# Patient Record
Sex: Female | Born: 1990 | Race: Black or African American | Hispanic: No | Marital: Single | Smoking: Never smoker
Health system: Southern US, Community
[De-identification: ages and names within clinical notes are randomized; demographics above are authoritative.]

## PROBLEM LIST (undated history)

## (undated) DIAGNOSIS — Z789 Other specified health status: Secondary | ICD-10-CM

## (undated) HISTORY — PX: WISDOM TOOTH EXTRACTION: SHX21

---

## 2014-03-10 ENCOUNTER — Encounter (HOSPITAL_COMMUNITY): Payer: Self-pay | Admitting: Emergency Medicine

## 2014-03-10 ENCOUNTER — Emergency Department (HOSPITAL_COMMUNITY): Payer: Managed Care, Other (non HMO)

## 2014-03-10 ENCOUNTER — Emergency Department (HOSPITAL_COMMUNITY)
Admission: EM | Admit: 2014-03-10 | Discharge: 2014-03-10 | Disposition: A | Discharge status: Home / Self Care | Payer: Managed Care, Other (non HMO) | Attending: Emergency Medicine | Admitting: Emergency Medicine

## 2014-03-10 DIAGNOSIS — R059 Cough, unspecified: Secondary | ICD-10-CM

## 2014-03-10 DIAGNOSIS — R69 Illness, unspecified: Secondary | ICD-10-CM

## 2014-03-10 DIAGNOSIS — J111 Influenza due to unidentified influenza virus with other respiratory manifestations: Secondary | ICD-10-CM

## 2014-03-10 DIAGNOSIS — R05 Cough: Secondary | ICD-10-CM | POA: Insufficient documentation

## 2014-03-10 DIAGNOSIS — R5383 Other fatigue: Secondary | ICD-10-CM | POA: Diagnosis not present

## 2014-03-10 MED ORDER — HYDROCODONE-ACETAMINOPHEN 5-325 MG PO TABS: ORAL_TABLET | ORAL | Status: DC

## 2014-03-10 MED ORDER — HYDROCODONE-ACETAMINOPHEN 5-325 MG PO TABS
1.0000 | ORAL_TABLET | Freq: Once | ORAL | Status: AC
  Administered 2014-03-10: 1 via ORAL
  Filled 2014-03-10: qty 1

## 2014-03-10 NOTE — Discharge Instructions (Signed)
Return to the emergency room for any worsening or concerning symptoms including fast breathing, heart racing, confusion, vomiting.  Rest, cover your mouth when you cough and wash your hands frequently.   Push fluids: water or Gatorade, do not drink any soda, juice or caffeinated beverages.  For fever and pain control you can take Motrin (ibuprofen) as follows: 400 mg (this is normally 2 over the counter pills) every 4 hours with food.  Do not return to work until a day after your fever breaks.   Take Vicodin for cough and pain control, do not drink alcohol, drive, care for children or do other critical tasks while taking Vicodin.    Emergency Department Resource Guide 1) Find a Doctor and Pay Out of Pocket Although you won't have to find out who is covered by your insurance plan, it is a good idea to ask around and get recommendations. You will then need to call the office and see if the doctor you have chosen will accept you as a new patient and what types of options they offer for patients who are self-pay. Some doctors offer discounts or will set up payment plans for their patients who do not have insurance, but you will need to ask so you aren't surprised when you get to your appointment.  2) Contact Your Local Health Department Not all health departments have doctors that can see patients for sick visits, but many do, so it is worth a call to see if yours does. If you don't know where your local health department is, you can check in your phone book. The CDC also has a tool to help you locate your state's health department, and many state websites also have listings of all of their local health departments.  3) Find a Walk-in Clinic If your illness is not likely to be very severe or complicated, you may want to try a walk in clinic. These are popping up all over the country in pharmacies, drugstores, and shopping centers. They're usually staffed by nurse practitioners or physician  assistants that have been trained to treat common illnesses and complaints. They're usually fairly quick and inexpensive. However, if you have serious medical issues or chronic medical problems, these are probably not your best option.  No Primary Care Doctor: - Call Health Connect at  [phone] - they can help you locate a primary care doctor that  accepts your insurance, provides certain services, etc. - Physician Referral Service- [phone]  Chronic Pain Problems: Organization         Address  Phone   Notes  [facility]  [phone]) [phone] Patients need to be referred by their primary care doctor.   Medication Assistance: Organization         Address  Phone   Notes  [facility] Medication [facility] [address] [place], [place] [address] [phone] --Must be a resident of [place] -- Must have NO insurance coverage whatsoever (no Medicaid/ Medicare, etc.) -- The pt. MUST have a primary care doctor that directs their care regularly and follows them in the community   MedAssist  [phone]   [employer]  [phone]    Agencies that provide inexpensive medical care: Organization         Address  Phone   Notes  [facility]  [phone]) [phone]   [facility]    [phone]) [phone]   [facility] [address]  [place], [place] [address] [phone]   [place] [address], [place] [phone]   Planned Parenthood    [phone]   [facility]    [phone]   [employer] and [place] [address], [place] Phone:  [phone], Fax:  [phone] Hours of Operation:  9 am - 6 pm, M-F.  Also accepts Medicaid/Medicare and self-pay.  [facility] for [address], [address], [place] Phone: [phone], Fax: [phone]. Hours of Operation:  8:30 am - 5:30 pm, M-F.  Also accepts  Medicaid and self-pay.  [facility] [place] [address], [place] Phone: [phone]   [place], [place], [place] [phone], Ext. [phone] Mondays & Thursdays: 7-9 AM.  First 15 patients are seen on a first come, first serve basis.    [place] Providers:  Organization         Address  Phone   Notes  [facility] [address], [place] [phone] Also accepts self-pay patients.  [facility] [address], [place]  [phone]   [place], [address], [place] [phone]   [facility] [address], [place] [phone]   Lucianne Lei [address], [place]   [phone] Only accepts [place] Access [place] patients after they have their name applied to their card.   Self-Pay (no insurance) in [facility]:  Organization         Address  Phone   Notes  Sickle Cell Patients, [facility] [place] [phone]   [facility] [place] [phone]   [facility] [place]  [place], [place], [place] [phone]   [facility]/Dr. Osei-Bonsu  [address], [place] or [address], [place] [phone] Phone number for both [place] and [place] locations is the same.  [facility] [address] [place] [phone]   [facility] [address] [place] or [address] [phone]   [facility] [address] [place] [phone], phone; [phone], fax Sees patients 1st and 3rd Saturday of every month.  Must not qualify for public or private insurance (i.e. Medicaid, Medicare, [place] Health Choice, Veterans' Benefits)  Household  income should be no more than 200% of the poverty level The clinic cannot treat you if you are pregnant or think you are pregnant  Sexually transmitted diseases are not treated at the clinic.    Dental Care: Organization         Address  Phone  Notes  [facility] [phone] Accepts children up to age 33 who are enrolled in [place] or [place]; pregnant women with a Medicaid card; and children who have applied for Medicaid or [place] Health Choice, but were declined, whose parents can pay a reduced fee at time of service.  [facility] of [facility]  [address], [place] [phone] Accepts children up to age 59 who are enrolled in [place] or [place]  Choice; pregnant women with a Medicaid card; and children who have applied for Medicaid or [place] Health Choice, but were declined, whose parents can pay a reduced fee at time of service.  [place] [facility] Adult Dental Access PROGRAM  [place] [phone] Patients are seen by appointment only. Walk-ins are not accepted. [place] will see patients 10 years of age and older. Monday - Tuesday (8am-5pm) Most Wednesdays (8:30-5pm) $30 per visit, cash only  [facility] Adult Dental Access PROGRAM  [address], [facility] [phone] Patients are seen by appointment only. Walk-ins are not accepted. [place] will see patients 67 years of age and older. One Wednesday Evening (Monthly: Volunteer Based).  $30 per visit, cash only  [place]  [phone] for adults; Children under age 37, call Graduate Pediatric Dentistry at [phone]. Children aged 26-14, please call [phone] to request a pediatric application.  Dental services are provided in all areas of dental care including fillings, crowns and bridges, complete and partial dentures, implants, gum  treatment, root canals, and extractions. Preventive care is also provided. Treatment is provided to both adults and children. Patients are selected via a lottery and there is often a waiting list.   [facility] [address] [place]  [phone] [email or web address]   [facility] [address] [place], [place] [phone], Ext. [phone] Second and Fourth Thursday of each month, opens at 6:30 AM; Clinic ends at 9 AM.  Patients are seen on a first-come first-served basis, and a limited number are seen during each clinic.   [facility]  [address] Hillard Dank[place], [place] [phone]   Eligibility Requirements You must have lived in [place], [place], or [place] counties for at least the last three months.   You cannot be eligible for state or federal sponsored [employer], including [employer], [place], or [employer].   You generally cannot be eligible for healthcare insurance through your employer.    How to apply: Eligibility screenings are held every Tuesday and Wednesday afternoon from 1:00 pm until 4:00 pm. You do not need an appointment for the interview!  [facility] [address], [place], [place]   [place]  [place] Department  [place]  [phone]    Behavioral Health Resources in the Community: Intensive Outpatient Programs Organization         Address  Phone  Notes  [place]. [address] [place], [place] [phone]   [facility] [address], [place], [place]   ADS: Alcohol & Drug Svcs [address] [place], [place]   [place] [address],  [place], [place] or [phone]   Substance Abuse Resources Organization         Address  Phone  Notes  Alcohol and  Drug Services  [phone]   [place]  [phone]   The [place]   Chinita Pester  [phone]   Residential & Outpatient Substance Abuse Program  [phone]   Psychological Services Organization         Address  Phone  Notes  [facility]  [place]  [phone]   [place] [address], [place] or [phone]  Mobile Crisis Teams Organization         Address  Phone  Notes  [facility], Mobile Crisis Care Unit  [phone]   [facility]  [address] [place], [place] [phone]   Doristine LocksSharon DeEsch [address] [place] [phone]    Self-Help/Support Groups Organization         Address  Phone             Notes  Mental Health Assoc. of [place] - variety of support groups  336- [phone] Call for more information  Narcotics Anonymous (NA), [facility]aring [facility] [address], [place]  2 meetings at this location   Statisticianesidential Treatment Programs Organization         Address  Phone  Notes  [facility] [address],    [place]  [phone]   [facility]  [address], [place] [phone], [place], [place] [phone]   [facility] [address], [place] [phone] Admissions: 8am-3pm M-F  [facility] [address]    [place], [place] [phone]   [facility] [address], [place], [place] [phone]   [facility] [address]  [place], [place] [phone]   [facility] - Intensive Outpatient [address], [place], [place] [phone]   [facility] (Addiction Recovery Care [facility] [address]  [place], [place] [phone] or [phone]   [facility] (RTS) [address] [place], [place] [phone] Accepts Medicaid  [place]  [place] [address]  [place] [phone] Substance Abuse/Addiction Treatment   [facility] Behavioral Health Resources Organization         Address  Phone  Notes  [facility]  [phone]) [phone]   Angie FavaJulie Brannon, PhD [address], [address] [place], [place]   [phone](336) [phone] or [phone]   [facility]   [address] [place], [place] [phone](336) [phone]   [facility] [address], [place], [place] [phone](336) [phone] Insurance/Medicaid/sponsorship through [facility]  Faith and Families [address]                                    [place], [place] [phone]) [phone] Therapy/tele-psych/case  [facility] [address].   [place], [place] [phone](336) [phone]    Dr. Lolly MustacheArfeen  [phone]) [phone]   [facility] [place]  [employer] [facility] 1) [address], [place] 2) [address], [place] 3)  [address], [place] [phone](336) [phone]) [phone]  [phone](336) [phone]   [facility] Child Abuse Hotline [phone](336) [phone] or [phone](336) [phone] (After Hours)

## 2014-03-10 NOTE — ED Notes (Signed)
Flu like symptoms starting a couple days ago. SOB, fatigue, headache, chills, sinus congestion.

## 2014-03-10 NOTE — ED Provider Notes (Signed)
CSN: [id number]     Arrival date & time 03/10/14  1234 History  This chart was scribed for Wynetta EmeryNicole Kimm Ungaro, PA-C, working with Arby BarretteMarcy Pfeiffer, MD found by Elon SpannerGarrett Cook, ED Scribe. This patient was seen in room WTR8/WTR8 and the patient's care was started at 1:19 PM.   Chief Complaint  Patient presents with  . Cough  . Fatigue   The history is provided by the patient.   HPI Comments: Martha Fox is a 23 y.o. female with no significant medical history who presents to the Emergency Department complaining of nasal congestion with associated dry cough, subjective fever, generalized weakness and mild SOB upon exertion.  Patient reports she took a medication last night for her symptoms but does not recall the name.  Patient denies recent sick contacts.  Patient denies receiving a flu shot.  Patient denies chest pain, abdominal pain, nausea, vomiting, ear pain, changes in frequency, changes in bowel movement patterns.      History reviewed. No pertinent past medical history. History reviewed. No pertinent past surgical history. History reviewed. No pertinent family history. History  Substance Use Topics  . Smoking status: Never Smoker   . Smokeless tobacco: Not on file  . Alcohol Use: No   OB History    No data available     Review of Systems A complete 10 system review of systems was obtained and all systems are negative except as noted in the HPI and PMH.    Allergies  Review of patient's allergies indicates no known allergies.  Home Medications   Prior to Admission medications   Not on File   BP 129/75 mmHg  Pulse 104  Temp(Src) 98.2 F (36.8 C) (Oral)  Resp 18  SpO2 100%  LMP 02/25/2014 Physical Exam  Constitutional: She is oriented to person, place, and time. She appears well-developed and well-nourished. No distress.  Nontoxic appearing  HENT:  Head: Normocephalic and atraumatic.  Mouth/Throat: Oropharynx is clear and moist.  Eyes: Conjunctivae and EOM are  normal. Pupils are equal, round, and reactive to light.  Neck: Normal range of motion. Neck supple.  FROM to C-spine. Pt can touch chin to chest without discomfort. No TTP of midline cervical spine.   Cardiovascular: Normal rate, regular rhythm and intact distal pulses.   Pulmonary/Chest: Effort normal and breath sounds normal. No stridor. No respiratory distress. She has no wheezes. She has no rales. She exhibits no tenderness.  Abdominal: Soft. Bowel sounds are normal. She exhibits no distension and no mass. There is no tenderness. There is no rebound and no guarding.  Musculoskeletal: Normal range of motion. She exhibits no edema or tenderness.  Neurological: She is alert and oriented to person, place, and time.  Psychiatric: She has a normal mood and affect.  Nursing note and vitals reviewed.   ED Course  Procedures (including critical care time)  DIAGNOSTIC STUDIES: Oxygen Saturation is 100% on RA, normal by my interpretation.    COORDINATION OF CARE:  1:27 PM Will order imaging to r/o pneumonia and prescribe pain medication.  Advised patient to use Motrin as needed and increase fluid intake.  Patient was offered but declined Tamiflu.  Patient advised of return precuations.  Patient acknowledges and agrees with plan.    Labs Review Labs Reviewed - No data to display  Imaging Review No results found.   EKG Interpretation None      MDM   Final diagnoses:  Cough    Filed Vitals:   03/10/14 1255 03/10/14 1412  BP: 129/75 109/54  Pulse: 104 99  Temp: 98.2 F (36.8 C)   TempSrc: Oral   Resp: 18 16  SpO2: 100% 99%    Medications  HYDROcodone-acetaminophen (NORCO/VICODIN) 5-325 MG per tablet 1 tablet (1 tablet Oral Given 03/10/14 1341)    Martha Fox is a 23 y.o. female presenting with dry cough, headache, myalgia, pharyngitis, rhinorrhea shortness of breath and fatigue. Symptoms consistent with influenza. Patient is otherwise healthy. Offered her Tamiflu  issues within the 48 hour window. Shared decision-making capacity patient declines Tamiflu, will treat symptomatically, advised patient to aggressively hydrate, control fever and extensive discussion of return precautions.  Evaluation does not show pathology that would require ongoing emergent intervention or inpatient treatment. Pt is hemodynamically stable and mentating appropriately. Discussed findings and plan with patient/guardian, who agrees with care plan. All questions answered. Return precautions discussed and outpatient follow up given.   Discharge Medication List as of 03/10/2014  2:06 PM    START taking these medications   Details  HYDROcodone-acetaminophen (NORCO/VICODIN) 5-325 MG per tablet Take 1-2 tablets by mouth every 6 hours as needed for pain., Print         I personally performed the services described in this documentation, which was scribed in my presence. The recorded information has been reviewed and is accurate.    Wynetta Emeryicole Denzell Colasanti, PA-C 03/10/14 2015  Arby BarretteMarcy Pfeiffer, MD 03/11/14 [phone]

## 2015-04-10 IMAGING — CR DG CHEST 2V
2 series · 2 of 2 positions shown · non-contrast
Comparison: None.

CLINICAL DATA: Flu-like symptoms for a couple of days. Fatigue with
shortness of breath and headache.

EXAM:
CHEST  2 VIEW

[w chest pa]
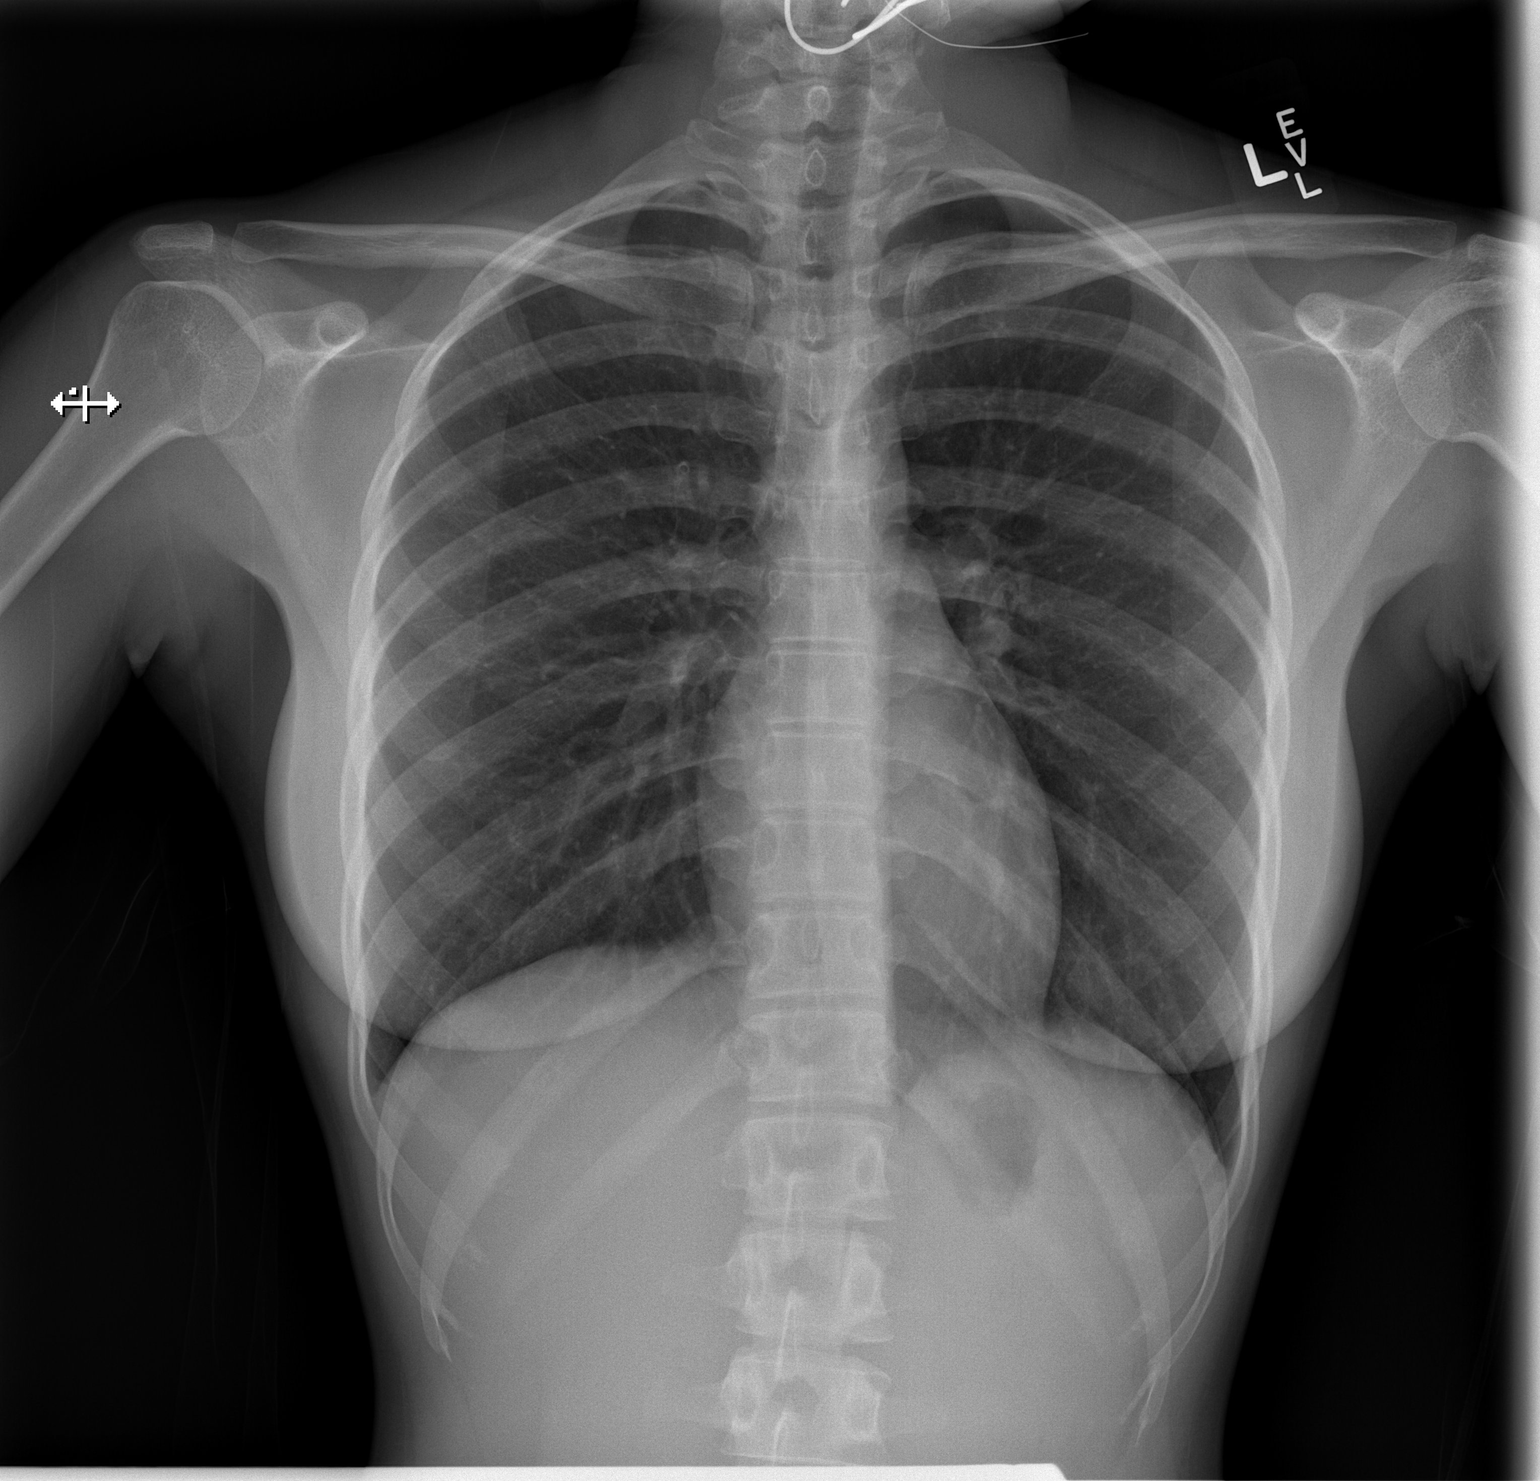

[w chest lat]
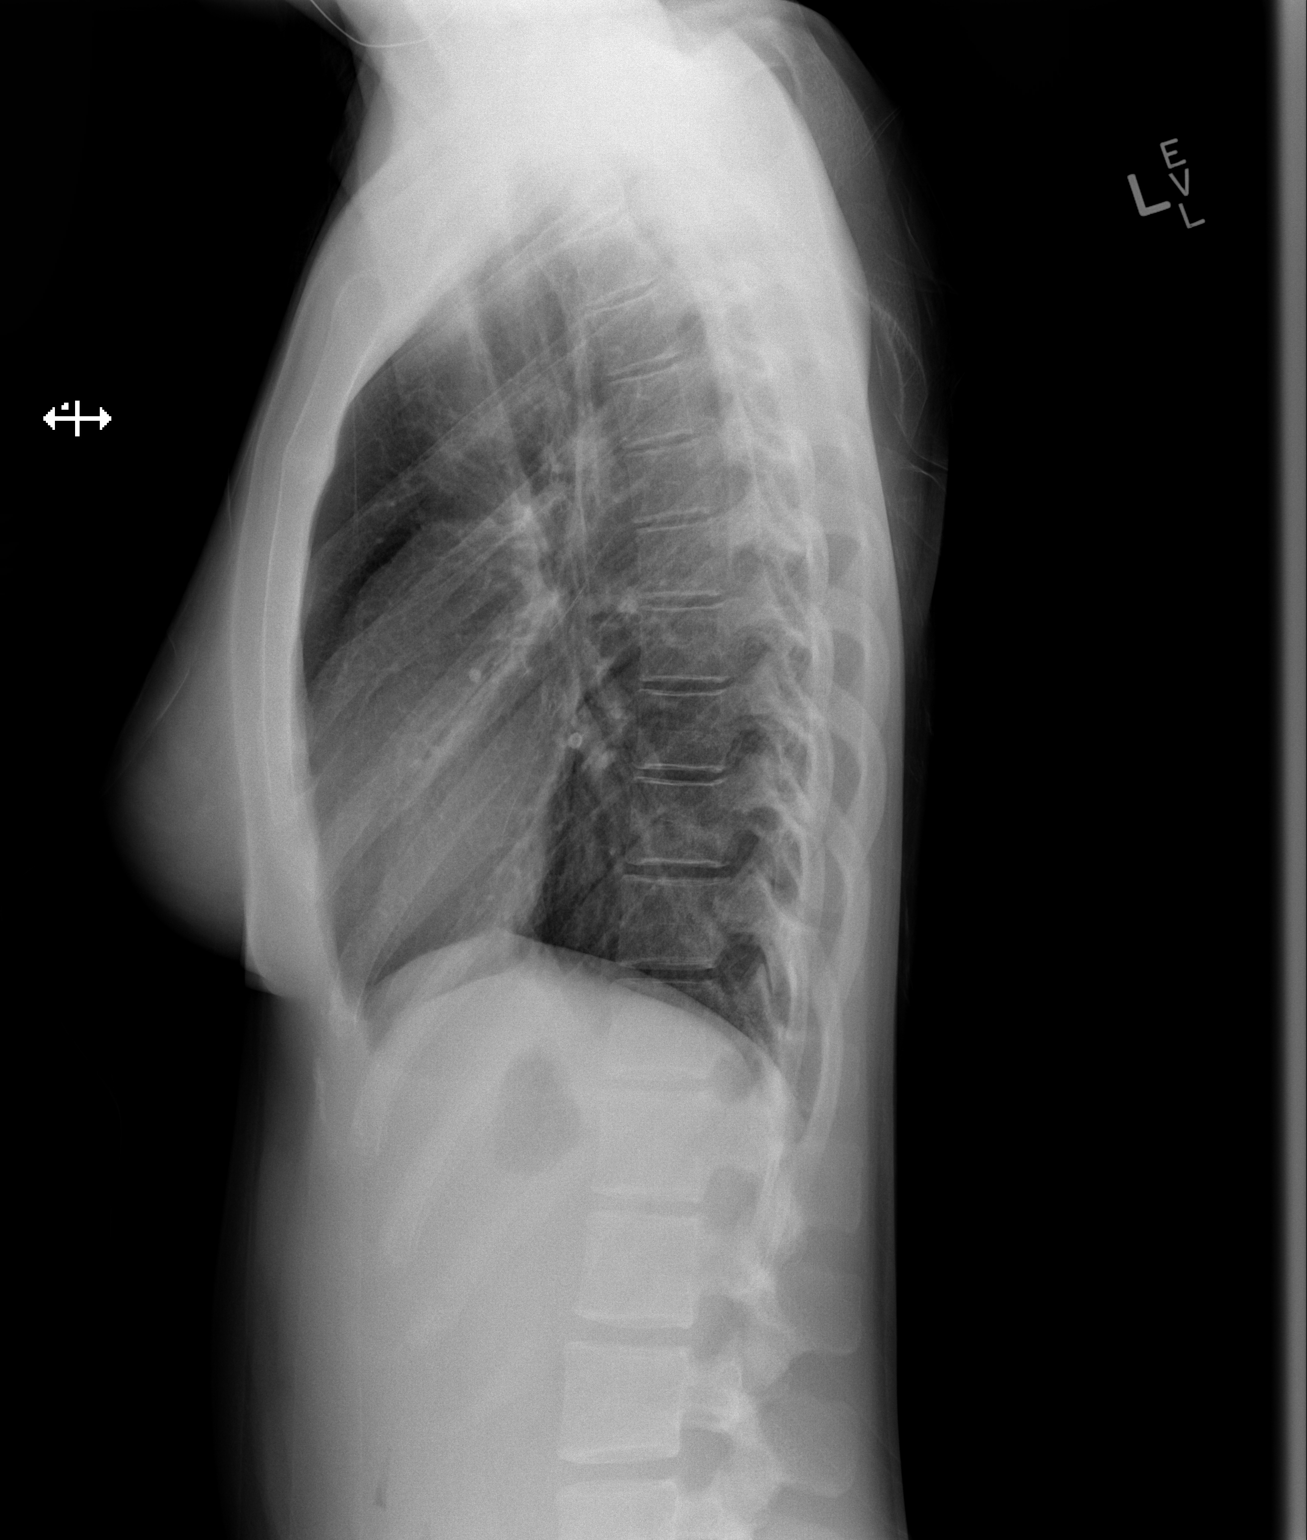

[2 of 2 positions shown; findings below may reference images not displayed]

FINDINGS: Lung volumes are within normal limits. Heart and mediastinum are
within normal limits. Trachea is midline. Lungs are clear. No acute
bone abnormality.
IMPRESSION: Normal chest examination.

## 2016-03-03 LAB — OB RESULTS CONSOLE ABO/RH: RH Type: POSITIVE

## 2016-03-03 LAB — OB RESULTS CONSOLE RPR: RPR: NONREACTIVE

## 2016-03-03 LAB — OB RESULTS CONSOLE ANTIBODY SCREEN: Antibody Screen: NEGATIVE

## 2016-03-03 LAB — OB RESULTS CONSOLE GC/CHLAMYDIA
CHLAMYDIA, DNA PROBE: NEGATIVE
Gonorrhea: NEGATIVE

## 2016-03-03 LAB — OB RESULTS CONSOLE RUBELLA ANTIBODY, IGM: Rubella: IMMUNE

## 2016-03-03 LAB — OB RESULTS CONSOLE HEPATITIS B SURFACE ANTIGEN: HEP B S AG: NEGATIVE

## 2016-03-03 LAB — OB RESULTS CONSOLE HIV ANTIBODY (ROUTINE TESTING): HIV: NONREACTIVE

## 2016-09-25 LAB — OB RESULTS CONSOLE GBS: GBS: NEGATIVE

## 2016-10-08 ENCOUNTER — Other Ambulatory Visit: Payer: Self-pay | Admitting: Obstetrics and Gynecology

## 2016-10-09 ENCOUNTER — Encounter (HOSPITAL_COMMUNITY): Payer: Self-pay | Admitting: *Deleted

## 2016-10-14 NOTE — Patient Instructions (Signed)
20 Amirra Ballin  10/14/2016   Your procedure is scheduled on:  10/17/2016  Enter through the Main Entrance [facility] at 1015 AM.  Pick up the phone at the desk and dial [phone].   Call this number if you have problems the morning of surgery: [phone]   Remember:   Do not eat food:After Midnight.  Do not drink clear liquids: After Midnight.  Take these medicines the morning of surgery with A SIP OF WATER: none   Do not wear jewelry, make-up or nail polish.  Do not wear lotions, powders, or perfumes. Do not wear deodorant.  Do not shave 48 hours prior to surgery.  Do not bring valuables to the hospital.  [facility] is not   responsible for any belongings or valuables brought to the hospital.  Contacts, dentures or bridgework may not be worn into surgery.  Leave suitcase in the car. After surgery it may be brought to your room.  For patients admitted to the hospital, checkout time is 11:00 AM the day of              discharge.   Patients discharged the day of surgery will not be allowed to drive             home.  Name and phone number of your driver: na  Special Instructions:   N/A   Please read over the following fact sheets that you were given:   Surgical Site Infection Prevention

## 2016-10-15 MED ORDER — GLYCOPYRROLATE 0.2 MG/ML IJ SOLN
INTRAMUSCULAR | Status: AC
  Filled 2016-10-15: qty 1

## 2016-10-15 MED ORDER — EPHEDRINE SULFATE 50 MG/ML IJ SOLN
INTRAMUSCULAR | Status: AC
  Filled 2016-10-15: qty 1

## 2016-10-15 MED ORDER — LIDOCAINE HCL 1 % IJ SOLN
INTRAMUSCULAR | Status: AC
  Filled 2016-10-15: qty 20

## 2016-10-15 MED ORDER — SCOPOLAMINE 1 MG/3DAYS TD PT72
MEDICATED_PATCH | TRANSDERMAL | Status: AC
  Filled 2016-10-15: qty 1

## 2016-10-15 MED ORDER — PHENYLEPHRINE 40 MCG/ML (10ML) SYRINGE FOR IV PUSH (FOR BLOOD PRESSURE SUPPORT)
PREFILLED_SYRINGE | INTRAVENOUS | Status: AC
  Filled 2016-10-15: qty 20

## 2016-10-16 ENCOUNTER — Encounter (HOSPITAL_COMMUNITY): Payer: Self-pay

## 2016-10-16 ENCOUNTER — Encounter (HOSPITAL_COMMUNITY)
Admission: RE | Admit: 2016-10-16 | Discharge: 2016-10-16 | Disposition: A | Discharge status: Home / Self Care | Payer: Medicaid Other | Source: Ambulatory Visit | Attending: Obstetrics and Gynecology | Admitting: Obstetrics and Gynecology

## 2016-10-16 HISTORY — DX: Other specified health status: Z78.9

## 2016-10-16 LAB — CBC
HCT: 42.6 % (ref 36.0–46.0)
HEMOGLOBIN: 14.6 g/dL (ref 12.0–15.0)
MCH: 30.1 pg (ref 26.0–34.0)
MCHC: 34.3 g/dL (ref 30.0–36.0)
MCV: 87.8 fL (ref 78.0–100.0)
Platelets: 174 10*3/uL (ref 150–400)
RBC: 4.85 MIL/uL (ref 3.87–5.11)
RDW: 15.4 % (ref 11.5–15.5)
WBC: 9.7 10*3/uL (ref 4.0–10.5)

## 2016-10-16 LAB — ABO/RH: ABO/RH(D): O POS

## 2016-10-16 LAB — TYPE AND SCREEN
ABO/RH(D): O POS
Antibody Screen: NEGATIVE

## 2016-10-16 NOTE — Pre-Procedure Instructions (Signed)
PAT completed by Royal Hawthornaney Damarkus Balis RNC on 10/16/2016:

## 2016-10-17 ENCOUNTER — Inpatient Hospital Stay (HOSPITAL_COMMUNITY)
Admission: RE | Admit: 2016-10-17 | Discharge: 2016-10-20 | DRG: 765 | Disposition: A | Discharge status: Home / Self Care | Payer: Medicaid Other | Source: Ambulatory Visit | Attending: Obstetrics and Gynecology | Admitting: Obstetrics and Gynecology

## 2016-10-17 ENCOUNTER — Encounter (HOSPITAL_COMMUNITY): Payer: Self-pay | Admitting: Anesthesiology

## 2016-10-17 ENCOUNTER — Encounter (HOSPITAL_COMMUNITY)
Admission: RE | Disposition: A | Discharge status: Home / Self Care | Payer: Self-pay | Source: Ambulatory Visit | Attending: Obstetrics and Gynecology

## 2016-10-17 ENCOUNTER — Inpatient Hospital Stay (HOSPITAL_COMMUNITY): Payer: Medicaid Other | Admitting: Anesthesiology

## 2016-10-17 DIAGNOSIS — O30043 Twin pregnancy, dichorionic/diamniotic, third trimester: Secondary | ICD-10-CM | POA: Diagnosis present

## 2016-10-17 DIAGNOSIS — Z3A38 38 weeks gestation of pregnancy: Secondary | ICD-10-CM

## 2016-10-17 DIAGNOSIS — O322XX2 Maternal care for transverse and oblique lie, fetus 2: Principal | ICD-10-CM | POA: Diagnosis present

## 2016-10-17 LAB — RPR: RPR Ser Ql: NONREACTIVE

## 2016-10-17 SURGERY — Surgical Case
Anesthesia: Spinal | Site: Abdomen | Wound class: Clean Contaminated

## 2016-10-17 MED ORDER — SIMETHICONE 80 MG PO CHEW
80.0000 mg | CHEWABLE_TABLET | ORAL | Status: DC
  Administered 2016-10-18 – 2016-10-20 (×3): 80 mg via ORAL
  Filled 2016-10-17 (×3): qty 1

## 2016-10-17 MED ORDER — PRENATAL MULTIVITAMIN CH
1.0000 | ORAL_TABLET | Freq: Every day | ORAL | Status: DC
  Administered 2016-10-18 – 2016-10-20 (×3): 1 via ORAL
  Filled 2016-10-17 (×3): qty 1

## 2016-10-17 MED ORDER — OXYTOCIN 10 UNIT/ML IJ SOLN
INTRAMUSCULAR | Status: AC
  Filled 2016-10-17: qty 4

## 2016-10-17 MED ORDER — MEPERIDINE HCL 25 MG/ML IJ SOLN
INTRAMUSCULAR | Status: DC | PRN
  Administered 2016-10-17 (×2): 12.5 mg via INTRAVENOUS

## 2016-10-17 MED ORDER — OXYCODONE-ACETAMINOPHEN 5-325 MG PO TABS
2.0000 | ORAL_TABLET | ORAL | Status: DC | PRN
  Administered 2016-10-19: 2 via ORAL

## 2016-10-17 MED ORDER — BUPIVACAINE IN DEXTROSE 0.75-8.25 % IT SOLN
INTRATHECAL | Status: AC
  Filled 2016-10-17: qty 2

## 2016-10-17 MED ORDER — DIBUCAINE 1 % RE OINT: 1.0000 "application " | TOPICAL_OINTMENT | RECTAL | Status: DC | PRN

## 2016-10-17 MED ORDER — LACTATED RINGERS IV SOLN
INTRAVENOUS | Status: DC
  Administered 2016-10-17 (×3): via INTRAVENOUS

## 2016-10-17 MED ORDER — KETOROLAC TROMETHAMINE 30 MG/ML IJ SOLN
30.0000 mg | Freq: Once | INTRAMUSCULAR | Status: DC | PRN
  Administered 2016-10-17: 30 mg via INTRAVENOUS

## 2016-10-17 MED ORDER — TETANUS-DIPHTH-ACELL PERTUSSIS 5-2.5-18.5 LF-MCG/0.5 IM SUSP: 0.5000 mL | Freq: Once | INTRAMUSCULAR | Status: DC

## 2016-10-17 MED ORDER — OXYTOCIN 10 UNIT/ML IJ SOLN
INTRAVENOUS | Status: DC | PRN
  Administered 2016-10-17: 40 [IU] via INTRAVENOUS

## 2016-10-17 MED ORDER — MORPHINE SULFATE (PF) 0.5 MG/ML IJ SOLN
INTRAMUSCULAR | Status: DC | PRN
  Administered 2016-10-17: .2 mg via INTRATHECAL

## 2016-10-17 MED ORDER — ZOLPIDEM TARTRATE 5 MG PO TABS: 5.0000 mg | ORAL_TABLET | Freq: Every evening | ORAL | Status: DC | PRN

## 2016-10-17 MED ORDER — CEFAZOLIN SODIUM-DEXTROSE 2-4 GM/100ML-% IV SOLN
2.0000 g | INTRAVENOUS | Status: AC
  Administered 2016-10-17: 2 g via INTRAVENOUS
  Filled 2016-10-17: qty 100

## 2016-10-17 MED ORDER — SIMETHICONE 80 MG PO CHEW: 80.0000 mg | CHEWABLE_TABLET | ORAL | Status: DC | PRN

## 2016-10-17 MED ORDER — DIPHENHYDRAMINE HCL 25 MG PO CAPS: 25.0000 mg | ORAL_CAPSULE | Freq: Four times a day (QID) | ORAL | Status: DC | PRN

## 2016-10-17 MED ORDER — SCOPOLAMINE 1 MG/3DAYS TD PT72
MEDICATED_PATCH | TRANSDERMAL | Status: DC | PRN
  Administered 2016-10-17: 1 via TRANSDERMAL

## 2016-10-17 MED ORDER — OXYCODONE-ACETAMINOPHEN 5-325 MG PO TABS
1.0000 | ORAL_TABLET | ORAL | Status: DC | PRN
  Administered 2016-10-18 – 2016-10-20 (×5): 1 via ORAL
  Filled 2016-10-17 (×7): qty 1

## 2016-10-17 MED ORDER — ACETAMINOPHEN 325 MG PO TABS
650.0000 mg | ORAL_TABLET | ORAL | Status: DC | PRN
  Administered 2016-10-18: 650 mg via ORAL
  Filled 2016-10-17: qty 2

## 2016-10-17 MED ORDER — ONDANSETRON HCL 4 MG/2ML IJ SOLN
INTRAMUSCULAR | Status: DC | PRN
  Administered 2016-10-17: 4 mg via INTRAVENOUS

## 2016-10-17 MED ORDER — KETOROLAC TROMETHAMINE 30 MG/ML IJ SOLN
INTRAMUSCULAR | Status: AC
  Filled 2016-10-17: qty 1

## 2016-10-17 MED ORDER — SCOPOLAMINE 1 MG/3DAYS TD PT72
MEDICATED_PATCH | TRANSDERMAL | Status: AC
  Filled 2016-10-17: qty 1

## 2016-10-17 MED ORDER — MEPERIDINE HCL 25 MG/ML IJ SOLN
INTRAMUSCULAR | Status: AC
  Filled 2016-10-17: qty 1

## 2016-10-17 MED ORDER — SIMETHICONE 80 MG PO CHEW
80.0000 mg | CHEWABLE_TABLET | Freq: Three times a day (TID) | ORAL | Status: DC
  Administered 2016-10-18 – 2016-10-20 (×4): 80 mg via ORAL
  Filled 2016-10-17 (×4): qty 1

## 2016-10-17 MED ORDER — MORPHINE SULFATE (PF) 0.5 MG/ML IJ SOLN
INTRAMUSCULAR | Status: AC
  Filled 2016-10-17: qty 10

## 2016-10-17 MED ORDER — METHYLERGONOVINE MALEATE 0.2 MG/ML IJ SOLN: 0.2000 mg | INTRAMUSCULAR | Status: DC | PRN

## 2016-10-17 MED ORDER — MEPERIDINE HCL 25 MG/ML IJ SOLN: 6.2500 mg | INTRAMUSCULAR | Status: DC | PRN

## 2016-10-17 MED ORDER — WITCH HAZEL-GLYCERIN EX PADS: 1.0000 "application " | MEDICATED_PAD | CUTANEOUS | Status: DC | PRN

## 2016-10-17 MED ORDER — MENTHOL 3 MG MT LOZG: 1.0000 | LOZENGE | OROMUCOSAL | Status: DC | PRN

## 2016-10-17 MED ORDER — SODIUM CHLORIDE 0.9 % IJ SOLN
INTRAMUSCULAR | Status: AC
  Filled 2016-10-17: qty 20

## 2016-10-17 MED ORDER — METHYLERGONOVINE MALEATE 0.2 MG PO TABS: 0.2000 mg | ORAL_TABLET | ORAL | Status: DC | PRN

## 2016-10-17 MED ORDER — HYDROMORPHONE HCL 1 MG/ML IJ SOLN: 0.2500 mg | INTRAMUSCULAR | Status: DC | PRN

## 2016-10-17 MED ORDER — BUPIVACAINE IN DEXTROSE 0.75-8.25 % IT SOLN
INTRATHECAL | Status: DC | PRN
Start: 2016-10-17 — End: 2016-10-17
  Administered 2016-10-17: 1.4 mL via INTRATHECAL

## 2016-10-17 MED ORDER — FENTANYL CITRATE (PF) 100 MCG/2ML IJ SOLN
INTRAMUSCULAR | Status: AC
  Filled 2016-10-17: qty 2

## 2016-10-17 MED ORDER — FENTANYL CITRATE (PF) 100 MCG/2ML IJ SOLN
INTRAMUSCULAR | Status: DC | PRN
  Administered 2016-10-17: 10 ug via INTRATHECAL

## 2016-10-17 MED ORDER — DEXAMETHASONE SODIUM PHOSPHATE 4 MG/ML IJ SOLN
INTRAMUSCULAR | Status: DC | PRN
  Administered 2016-10-17: 4 mg via INTRAVENOUS

## 2016-10-17 MED ORDER — ONDANSETRON HCL 4 MG/2ML IJ SOLN
INTRAMUSCULAR | Status: AC
  Filled 2016-10-17: qty 2

## 2016-10-17 MED ORDER — LACTATED RINGERS IV SOLN
INTRAVENOUS | Status: DC
  Administered 2016-10-18: 01:00:00 via INTRAVENOUS

## 2016-10-17 MED ORDER — IBUPROFEN 600 MG PO TABS
600.0000 mg | ORAL_TABLET | Freq: Four times a day (QID) | ORAL | Status: DC
  Administered 2016-10-18 – 2016-10-20 (×11): 600 mg via ORAL
  Filled 2016-10-17 (×11): qty 1

## 2016-10-17 MED ORDER — SENNOSIDES-DOCUSATE SODIUM 8.6-50 MG PO TABS
2.0000 | ORAL_TABLET | ORAL | Status: DC
  Administered 2016-10-18 – 2016-10-20 (×3): 2 via ORAL
  Filled 2016-10-17 (×3): qty 2

## 2016-10-17 MED ORDER — OXYTOCIN 40 UNITS IN LACTATED RINGERS INFUSION - SIMPLE MED: 2.5000 [IU]/h | INTRAVENOUS | Status: AC

## 2016-10-17 MED ORDER — PROMETHAZINE HCL 25 MG/ML IJ SOLN: 6.2500 mg | INTRAMUSCULAR | Status: DC | PRN

## 2016-10-17 MED ORDER — COCONUT OIL OIL: 1.0000 "application " | TOPICAL_OIL | Status: DC | PRN

## 2016-10-17 MED ORDER — BUPIVACAINE HCL (PF) 0.25 % IJ SOLN
INTRAMUSCULAR | Status: AC
  Filled 2016-10-17: qty 30

## 2016-10-17 SURGICAL SUPPLY — 34 items
CHLORAPREP W/TINT 26ML (MISCELLANEOUS) ×3 IMPLANT
CLAMP CORD UMBIL (MISCELLANEOUS) ×6 IMPLANT
CLOTH BEACON ORANGE TIMEOUT ST (SAFETY) ×3 IMPLANT
DERMABOND ADVANCED (GAUZE/BANDAGES/DRESSINGS) ×2
DERMABOND ADVANCED .7 DNX12 (GAUZE/BANDAGES/DRESSINGS) ×1 IMPLANT
DRSG OPSITE POSTOP 4X10 (GAUZE/BANDAGES/DRESSINGS) ×3 IMPLANT
ELECT REM PT RETURN 9FT ADLT (ELECTROSURGICAL) ×3
ELECTRODE REM PT RTRN 9FT ADLT (ELECTROSURGICAL) ×1 IMPLANT
EXTRACTOR VACUUM M CUP 4 TUBE (SUCTIONS) IMPLANT
EXTRACTOR VACUUM M CUP 4' TUBE (SUCTIONS)
GLOVE BIO SURGEON STRL SZ7 (GLOVE) ×3 IMPLANT
GLOVE BIOGEL PI IND STRL 7.0 (GLOVE) ×5 IMPLANT
GLOVE BIOGEL PI INDICATOR 7.0 (GLOVE) ×10
GOWN STRL REUS W/TWL LRG LVL3 (GOWN DISPOSABLE) ×9 IMPLANT
KIT ABG SYR 3ML LUER SLIP (SYRINGE) IMPLANT
NDL SAFETY ECLIPSE 18X1.5 (NEEDLE) ×2 IMPLANT
NEEDLE HYPO 18GX1.5 SHARP (NEEDLE) ×4
NEEDLE HYPO 22GX1.5 SAFETY (NEEDLE) IMPLANT
NEEDLE HYPO 25X5/8 SAFETYGLIDE (NEEDLE) IMPLANT
NS IRRIG 1000ML POUR BTL (IV SOLUTION) ×3 IMPLANT
PACK C SECTION WH (CUSTOM PROCEDURE TRAY) ×3 IMPLANT
PAD OB MATERNITY 4.3X12.25 (PERSONAL CARE ITEMS) ×3 IMPLANT
PENCIL SMOKE EVAC W/HOLSTER (ELECTROSURGICAL) ×3 IMPLANT
RTRCTR C-SECT PINK 25CM LRG (MISCELLANEOUS) ×3 IMPLANT
SUT CHROMIC 1 CTX 36 (SUTURE) ×6 IMPLANT
SUT CHROMIC 2 0 CT 1 (SUTURE) ×6 IMPLANT
SUT PDS AB 0 CTX 60 (SUTURE) ×3 IMPLANT
SUT VIC AB 2-0 CT1 27 (SUTURE) ×2
SUT VIC AB 2-0 CT1 TAPERPNT 27 (SUTURE) ×1 IMPLANT
SUT VIC AB 4-0 KS 27 (SUTURE) IMPLANT
SYR 30ML LL (SYRINGE) ×3 IMPLANT
SYR BULB 3OZ (MISCELLANEOUS) ×3 IMPLANT
TOWEL OR 17X24 6PK STRL BLUE (TOWEL DISPOSABLE) ×3 IMPLANT
TRAY FOLEY BAG SILVER LF 14FR (SET/KITS/TRAYS/PACK) ×3 IMPLANT

## 2016-10-17 NOTE — Progress Notes (Signed)
FHR B doppler  143

## 2016-10-17 NOTE — Transfer of Care (Signed)
Immediate Anesthesia Transfer of Care Note  Patient: Martha SmokeJasmine Rohm  Procedure(s) Performed: Procedure(s) with comments: CESAREAN SECTION MULTI-GESTATIONAL (N/A) -  clear drape requested  Patient Location: PACU  Anesthesia Type:Spinal  Level of Consciousness: awake, alert  and oriented  Airway & Oxygen Therapy: Patient Spontanous Breathing  Post-op Assessment: Report given to RN and Post -op Vital signs reviewed and stable  Post vital signs: Reviewed and stable  Last Vitals:  Vitals:   10/17/16 1029  BP: 138/79  Pulse: (!) 112  Resp: 18  Temp: 36.9 C    Last Pain:  Vitals:   10/17/16 1029  TempSrc: Oral         Complications: No apparent anesthesia complications

## 2016-10-17 NOTE — Anesthesia Preprocedure Evaluation (Addendum)
Anesthesia Evaluation  Patient identified by MRN, date of birth, ID band Patient awake    Reviewed: Allergy & Precautions, H&P , NPO status , Patient's Chart, lab work & pertinent test results  Airway Mallampati: I  TM Distance: >3 FB Neck ROM: full    Dental no notable dental hx. (+) Teeth Intact   Pulmonary neg pulmonary ROS,    Pulmonary exam normal breath sounds clear to auscultation       Cardiovascular negative cardio ROS Normal cardiovascular exam Rhythm:regular Rate:Normal     Neuro/Psych negative neurological ROS  negative psych ROS   GI/Hepatic negative GI ROS, Neg liver ROS,   Endo/Other  negative endocrine ROS  Renal/GU negative Renal ROS  negative genitourinary   Musculoskeletal negative musculoskeletal ROS (+)   Abdominal Normal abdominal exam  (+)   Peds  Hematology negative hematology ROS (+)   Anesthesia Other Findings   Reproductive/Obstetrics (+) Pregnancy                            Anesthesia Physical Anesthesia Plan  ASA: II  Anesthesia Plan: Spinal   Post-op Pain Management:    Induction:   PONV Risk Score and Plan: 3 and Ondansetron, Dexamethasone, Propofol, Midazolam and Treatment may vary due to age or medical condition  Airway Management Planned: Natural Airway and Nasal Cannula  Additional Equipment:   Intra-op Plan:   Post-operative Plan:   Informed Consent: I have reviewed the patients History and Physical, chart, labs and discussed the procedure including the risks, benefits and alternatives for the proposed anesthesia with the patient or authorized representative who has indicated his/her understanding and acceptance.     Plan Discussed with: CRNA and Surgeon  Anesthesia Plan Comments:         Anesthesia Quick Evaluation

## 2016-10-17 NOTE — Anesthesia Postprocedure Evaluation (Signed)
Anesthesia Post Note  Patient: Martha Fox  Procedure(s) Performed: Procedure(s) (LRB): CESAREAN SECTION MULTI-GESTATIONAL (N/A)     Patient location during evaluation: PACU Anesthesia Type: Spinal Level of consciousness: awake Pain management: pain level controlled Vital Signs Assessment: post-procedure vital signs reviewed and stable Respiratory status: spontaneous breathing Cardiovascular status: stable Postop Assessment: no headache, no backache, spinal receding, patient able to bend at knees and no signs of nausea or vomiting Anesthetic complications: no    Last Vitals:  Vitals:   10/17/16 1445 10/17/16 1500  BP: 132/83 138/83  Pulse: 94 68  Resp: (!) 24 (!) 25  Temp:      Last Pain:  Vitals:   10/17/16 1415  TempSrc: Oral   Pain Goal:                 Weslee Fogg JR,JOHN Jaelin Devincentis

## 2016-10-17 NOTE — Anesthesia Procedure Notes (Addendum)
Spinal  Patient location during procedure: OR Start time: 10/17/2016 12:20 PM End time: 10/17/2016 12:28 PM Staffing Anesthesiologist: Leilani AbleHATCHETT, Kinzy Weyers Performed: anesthesiologist  Preanesthetic Checklist Completed: patient identified, surgical consent, pre-op evaluation, timeout performed, IV checked, risks and benefits discussed and monitors and equipment checked Spinal Block Patient position: sitting Prep: site prepped and draped and DuraPrep Patient monitoring: heart rate, cardiac monitor, continuous pulse ox and blood pressure Approach: midline Location: L3-4 Injection technique: single-shot Needle Needle type: Pencan  Needle gauge: 24 G Needle length: 10 cm Needle insertion depth: 5 cm Assessment Sensory level: T4

## 2016-10-17 NOTE — H&P (Signed)
Martha Fox is a 26 y.o. female presenting for primary cesarean section  26 yo G1P0 @ 38+1 with Devon Energydi di twin gestation with transverse back down lie of baby B. The patient has been completely intolerant of cervical exams. Given malpresentation of baby B and intolerance of pelvic exams the decision was made to proceed with primary cesarean section OB History    Gravida Para Term Preterm AB Living   1             SAB TAB Ectopic Multiple Live Births                 Past Medical History:  Diagnosis Date  . Medical history non-contributory    Past Surgical History:  Procedure Laterality Date  . WISDOM TOOTH EXTRACTION     Family History: family history includes Diabetes in her maternal grandmother; Hypertension in her mother. Social History:  reports that she has never smoked. She has never used smokeless tobacco. She reports that she does not drink alcohol or use drugs.     Maternal Diabetes: No Genetic Screening: Declined Maternal Ultrasounds/Referrals: Normal Fetal Ultrasounds or other Referrals:  None Maternal Substance Abuse:  No Significant Maternal Medications:  None Significant Maternal Lab Results:  None Other Comments:  None  ROS History   Blood pressure 138/79, pulse (!) 112, temperature 98.5 F (36.9 C), temperature source Oral, resp. rate 18, height 5\' 2"  (1.575 m), weight 64.4 kg (142 lb). Exam Physical Exam  Prenatal labs: ABO, Rh: --/--/O POS (07/05 0930) Antibody: NEG (07/05 0920) Rubella: Immune (11/20 0000) RPR: Non Reactive (07/05 0930)  HBsAg: Negative (11/20 0000)  HIV: Non-reactive (11/20 0000)  GBS: Negative (06/14 0000)   Assessment/Plan: 1) Admit 2) SCDs to OR 3) Antibiotics OCTOR 4) Consent for cesarean section. R/B/A reviewed.  Lynnette Pote H. 10/17/2016, 11:46 AM

## 2016-10-17 NOTE — Progress Notes (Signed)
UR chart review completed.  

## 2016-10-17 NOTE — Addendum Note (Signed)
Addendum  created 10/17/16 2137 by Shanon PayorGregory, Philis Doke M, CRNA   Sign clinical note

## 2016-10-17 NOTE — Anesthesia Postprocedure Evaluation (Signed)
Anesthesia Post Note  Patient: Martha Fox  Procedure(s) Performed: Procedure(s) (LRB): CESAREAN SECTION MULTI-GESTATIONAL (N/A)     Patient location during evaluation: Mother Baby Anesthesia Type: Spinal Level of consciousness: awake and alert and oriented Pain management: satisfactory to patient Vital Signs Assessment: post-procedure vital signs reviewed and stable Respiratory status: spontaneous breathing and nonlabored ventilation Cardiovascular status: stable Postop Assessment: no headache, no backache, patient able to bend at knees, no signs of nausea or vomiting and adequate PO intake Anesthetic complications: no    Last Vitals:  Vitals:   10/17/16 1715 10/17/16 1815  BP: 131/85 132/77  Pulse: 68 71  Resp: 17 18  Temp: 37.7 C 37.3 C    Last Pain:  Vitals:   10/17/16 1815  TempSrc: Oral   Pain Goal:                 Madison HickmanGREGORY,Martena Emanuele

## 2016-10-17 NOTE — Op Note (Signed)
Pre-Operative Diagnosis: 1) 38+1 week dichorionic diamniotic twin gestation 2) malpresentation of baby B Postoperative Diagnosis:  1) 38+1 week dichorionic diamniotic twin gestation  Procedure: Primary low transverse cesarean section Surgeon: Dr. Waynard ReedsKendra Kydan Shanholtzer Assistant: Dr. Ilda Moriichard Kaplan Operative Findings: Vigorous female infant in the vertex presentation with apgars of 9 at 1 minute and 9 at 5 minutes. After delivery of A baby B came down in the vertex presentation and she was delivered en caul. Amniotomy was performed after 30 seconds and the cord was clamped soon thereafter. Agpars were 8 at 1 and 9 at 5 minutes. Normal ovaries and tubes bilaterally. Specimen: Placenta for donation EBL: Total I/O In: 1000 [I.V.:1000] Out: 850 [Urine:250; Blood:600]   Procedure:Ms. Martha Fox is an 26 year old gravida 1 para 0 at 4138 weeks and 1 days estimated gestational age who presents for cesarean section. Following the appropriate informed consent the patient was brought to the operating room where spinal anesthesia was administered and found to be adequate. She was placed in the dorsal supine position with a leftward tilt. She was prepped and draped in the normal sterile fashion. After the patient was appropriately identified in a pre-operative time out procedure the Scalpel was then used to make a Pfannenstiel skin incision which was carried down to the underlying layers of soft tissue to the fascia. The fascia was incised in the midline and the fascial incision was extended laterally with Mayo scissors. The superior aspect of the fascial incision was grasped with Coker clamps x2, tented up and the rectus muscles dissected off sharply with the electrocautery unit area and the same procedure was repeated on the inferior aspect of the fascial incision. The rectus muscles were separated in the midline. The abdominal peritoneum was identified, tented up, entered sharply, and the incision was extended superiorly and  inferiorly with good visualization of the bladder. The Alexis retractor was then deployed. The vesicouterine peritoneum was identified, tented up, entered sharply, and the bladder flap was created digitally. Scalpel was then used to make a low transverse incision on the uterus which was extended laterally with blunt dissection. The fetal vertex was identified, delivered easily through the uterine incision followed by the body as noted above. After a 1 minute delay Baby A was passed to the neonatology team. Baby B was then delivered en caul in the vertex presentation. The infant was bulb suctioned on the operative field cried vigorously, cord was clamped and cut and the infant was passed to the waiting neonatologist. Placenta was then delivered spontaneously with baby B, the uterus was cleared of all clot and debris. The uterine incision was repaired with #1 chromic in running locked fashion followed by a second imbricating layer. Ovaries and tubes were inspected and normal. The Alexis retractor was removed.  The abdominal peritoneum was reapproximated with 2-0 Vicryl in a running fashion, the rectus muscles was reapproximated with 2-0 chromic in a running fashion. The fascia was closed with a looped PDS in a running fashion. The skin was closed with 4-0 vicryl in a subcuticular fashion and Dermabond. All sponge lap and needle counts were correct x2. Patient tolerated the procedure well and recovered in stable condition following the procedure.

## 2016-10-18 ENCOUNTER — Encounter (HOSPITAL_COMMUNITY): Payer: Self-pay | Admitting: Obstetrics

## 2016-10-18 LAB — CBC
HCT: 33.7 % — ABNORMAL LOW (ref 36.0–46.0)
Hemoglobin: 11.5 g/dL — ABNORMAL LOW (ref 12.0–15.0)
MCH: 29.5 pg (ref 26.0–34.0)
MCHC: 34.1 g/dL (ref 30.0–36.0)
MCV: 86.4 fL (ref 78.0–100.0)
PLATELETS: 159 10*3/uL (ref 150–400)
RBC: 3.9 MIL/uL (ref 3.87–5.11)
RDW: 15.4 % (ref 11.5–15.5)
WBC: 18.5 10*3/uL — ABNORMAL HIGH (ref 4.0–10.5)

## 2016-10-18 LAB — BIRTH TISSUE RECOVERY COLLECTION (PLACENTA DONATION)

## 2016-10-18 NOTE — Progress Notes (Signed)
Patient is doing well.  She is tolerating PO, ambulating.  Foley catheter has not yet been removed.  Pain is controlled.  Lochia is appropriate  Vitals:   10/17/16 1715 10/17/16 1815 10/17/16 2213 10/18/16 0215  BP: 131/85 132/77 124/62 130/70  Pulse: 68 71 70 74  Resp: 17 18 18 18   Temp: 99.8 F (37.7 C) 99.1 F (37.3 C) 98.7 F (37.1 C) 98.8 F (37.1 C)  TempSrc: Oral Oral Oral Oral  SpO2: 98% 99% 98%   Weight:      Height:      Uop 100 cc/hr  NAD Abdomen:  soft, appropriate tenderness, incisions intact and without erythema or drainage ext:    Symmetric, no edema bilaterally  Lab Results  Component Value Date   WBC 18.5 (H) 10/18/2016   HGB 11.5 (L) 10/18/2016   HCT 33.7 (L) 10/18/2016   MCV 86.4 10/18/2016   PLT 159 10/18/2016    --/--/O POS (07/05 0930)/RI  A/P    26 y.o. G1P1002 POD 1 s/p primary CD for twins Routine post op and postpartum care.   Bottle feeding Remove foley, ambulate today Desires circ for baby boy--will need to pay

## 2016-10-19 NOTE — Progress Notes (Signed)
Patient is doing well.  She is tolerating PO, ambulating, voiding without difficulty. Pain is controlled.  Lochia is appropriate  Vitals:   10/18/16 1019 10/18/16 1330 10/18/16 1853 10/19/16 0546  BP: (!) 112/53 120/68 116/65 116/69  Pulse: 67 65 71 77  Resp: 20 20 20 18   Temp: 98.3 F (36.8 C) 98.6 F (37 C) 98.4 F (36.9 C) 98.4 F (36.9 C)  TempSrc:    Oral  SpO2:  98%    Weight:      Height:      Uop 100 cc/hr  NAD Abdomen:  soft, appropriate tenderness, incisions intact and without erythema or drainage ext:    Symmetric, no edema bilaterally  Lab Results  Component Value Date   WBC 18.5 (H) 10/18/2016   HGB 11.5 (L) 10/18/2016   HCT 33.7 (L) 10/18/2016   MCV 86.4 10/18/2016   PLT 159 10/18/2016    --/--/O POS (07/05 0930)/RI  A/P    26 y.o. G1P1002 POD 2 s/p primary CD for twins Routine post op and postpartum care.   Bottle feeding Desires circumcision. Discussed r/b/a of the procedure. Reviewed that circumcision is an elective surgical procedure and not considered medically necessary. Reviewed the risks of the procedure including the risk of infection, bleeding, damage to surrounding structures, including scrotum, shaft, urethra and head of penis, and an undesired cosmetic effect requiring additional procedures for revision. Consent signed.

## 2016-10-20 MED ORDER — IBUPROFEN 600 MG PO TABS: 600.0000 mg | ORAL_TABLET | Freq: Four times a day (QID) | ORAL | 0 refills | Status: AC | PRN

## 2016-10-20 MED ORDER — DOCUSATE SODIUM 100 MG PO CAPS: 100.0000 mg | ORAL_CAPSULE | Freq: Two times a day (BID) | ORAL | 0 refills | Status: AC

## 2016-10-20 MED ORDER — OXYCODONE-ACETAMINOPHEN 5-325 MG PO TABS: 1.0000 | ORAL_TABLET | ORAL | 0 refills | Status: AC | PRN

## 2016-10-20 NOTE — Discharge Summary (Signed)
Obstetric Discharge Summary Reason for Admission: cesarean section Prenatal Procedures: ultrasound Intrapartum Procedures: cesarean: low cervical, transverse Postpartum Procedures: none Complications-Operative and Postpartum: none Hemoglobin  Date Value Ref Range Status  10/18/2016 11.5 (L) 12.0 - 15.0 g/dL Final    Comment:    DELTA CHECK NOTED REPEATED TO VERIFY    HCT  Date Value Ref Range Status  10/18/2016 33.7 (L) 36.0 - 46.0 % Final    Physical Exam:  General: alert, cooperative and appears stated age 53Lochia: appropriate Uterine Fundus: firm Incision: healing well DVT Evaluation: No evidence of DVT seen on physical exam.  Discharge Diagnoses: Term Pregnancy-delivered  Discharge Information: Date: 10/20/2016 Activity: pelvic rest Diet: routine Medications: Ibuprofen, Colace and Percocet Condition: improved Instructions: refer to practice specific booklet Discharge to: home Follow-up Information    Waynard Reedsoss, Colton Engdahl, MD Follow up in 4 week(s).   Specialty:  Obstetrics and Gynecology Why:  For a postpartum evaluation Contact information: [address] [place] [id number] [phone]           Newborn Data:   Tawni Pummelmerson, BoyA Yenni [[id number]]  Live born female  Birth Weight: 5 lb 7.8 oz (2490 g) APGAR: 9, 9   Sharren Bridgemerson, GirlB Siria [[id number]]  Live born female  Birth Weight: 4 lb 15 oz (2240 g) APGAR: 8, 9  Home with mother.  Lin Glazier H. 10/20/2016, 8:49 AM

## 2016-10-22 ENCOUNTER — Encounter (HOSPITAL_COMMUNITY): Payer: Self-pay | Admitting: *Deleted
